# Patient Record
Sex: Female | Born: 1980 | Hispanic: No | Marital: Married | Smoking: Never smoker
Health system: Southern US, Community
[De-identification: ages and names within clinical notes are randomized; demographics above are authoritative.]

---

## 2013-02-09 ENCOUNTER — Encounter (HOSPITAL_COMMUNITY): Payer: Self-pay | Admitting: Emergency Medicine

## 2013-02-09 ENCOUNTER — Emergency Department (HOSPITAL_COMMUNITY): Payer: No Typology Code available for payment source

## 2013-02-09 ENCOUNTER — Emergency Department (HOSPITAL_COMMUNITY)
Admission: EM | Admit: 2013-02-09 | Discharge: 2013-02-09 | Disposition: A | Discharge status: Home / Self Care | Payer: No Typology Code available for payment source | Attending: Emergency Medicine | Admitting: Emergency Medicine

## 2013-02-09 DIAGNOSIS — Y9289 Other specified places as the place of occurrence of the external cause: Secondary | ICD-10-CM | POA: Insufficient documentation

## 2013-02-09 DIAGNOSIS — M25511 Pain in right shoulder: Secondary | ICD-10-CM

## 2013-02-09 DIAGNOSIS — S46909A Unspecified injury of unspecified muscle, fascia and tendon at shoulder and upper arm level, unspecified arm, initial encounter: Secondary | ICD-10-CM | POA: Insufficient documentation

## 2013-02-09 DIAGNOSIS — R0781 Pleurodynia: Secondary | ICD-10-CM

## 2013-02-09 DIAGNOSIS — S50311A Abrasion of right elbow, initial encounter: Secondary | ICD-10-CM

## 2013-02-09 DIAGNOSIS — S4980XA Other specified injuries of shoulder and upper arm, unspecified arm, initial encounter: Secondary | ICD-10-CM | POA: Insufficient documentation

## 2013-02-09 DIAGNOSIS — IMO0002 Reserved for concepts with insufficient information to code with codable children: Secondary | ICD-10-CM | POA: Insufficient documentation

## 2013-02-09 DIAGNOSIS — Y9301 Activity, walking, marching and hiking: Secondary | ICD-10-CM | POA: Insufficient documentation

## 2013-02-09 DIAGNOSIS — S298XXA Other specified injuries of thorax, initial encounter: Secondary | ICD-10-CM | POA: Insufficient documentation

## 2013-02-09 DIAGNOSIS — S90811A Abrasion, right foot, initial encounter: Secondary | ICD-10-CM

## 2013-02-09 MED ORDER — IBUPROFEN 600 MG PO TABS: 600.0000 mg | ORAL_TABLET | Freq: Four times a day (QID) | ORAL | Status: AC | PRN
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
## 2013-02-09 NOTE — ED Notes (Signed)
Patient to CT then to PEDS with Child (patient) This POC was agreed upon by [facility] MD & Carolyne Littles MD

## 2013-03-07 ENCOUNTER — Emergency Department (HOSPITAL_COMMUNITY)
Admission: EM | Admit: 2013-03-07 | Discharge: 2013-03-08 | Disposition: A | Discharge status: Home / Self Care | Payer: No Typology Code available for payment source | Attending: Emergency Medicine | Admitting: Emergency Medicine

## 2013-03-07 ENCOUNTER — Encounter (HOSPITAL_COMMUNITY): Payer: Self-pay | Admitting: Emergency Medicine

## 2013-03-07 DIAGNOSIS — M542 Cervicalgia: Secondary | ICD-10-CM | POA: Insufficient documentation

## 2013-03-07 DIAGNOSIS — F411 Generalized anxiety disorder: Secondary | ICD-10-CM | POA: Insufficient documentation

## 2013-03-07 DIAGNOSIS — M546 Pain in thoracic spine: Secondary | ICD-10-CM | POA: Diagnosis present

## 2013-03-07 DIAGNOSIS — F419 Anxiety disorder, unspecified: Secondary | ICD-10-CM
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
## 2013-03-08 NOTE — ED Provider Notes (Signed)
Medical screening examination/treatment/procedure(s) were performed by non-physician practitioner and as supervising physician I was immediately available for consultation/collaboration.  Eiza Canniff M Josafat Enrico, MD 03/08/13 0608 

## 2013-03-08 NOTE — ED Notes (Signed)
Pt denies any questions upon discharge. 

## 2013-11-18 ENCOUNTER — Other Ambulatory Visit: Payer: Self-pay | Admitting: Obstetrics and Gynecology

## 2013-11-18 DIAGNOSIS — N6320 Unspecified lump in the left breast, unspecified quadrant: Secondary | ICD-10-CM

## 2013-12-06 ENCOUNTER — Other Ambulatory Visit: Payer: Self-pay

## 2013-12-17 ENCOUNTER — Inpatient Hospital Stay: Admission: RE | Admit: 2013-12-17 | Payer: Self-pay | Source: Ambulatory Visit
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
